# Patient Record
Sex: Female | Born: 1950 | Race: White | Hispanic: No | Smoking: Current every day smoker
Health system: Southern US, Community
[De-identification: ages and names within clinical notes are randomized; demographics above are authoritative.]

## PROBLEM LIST (undated history)

## (undated) DIAGNOSIS — T7840XA Allergy, unspecified, initial encounter: Secondary | ICD-10-CM

## (undated) DIAGNOSIS — I639 Cerebral infarction, unspecified: Secondary | ICD-10-CM

## (undated) DIAGNOSIS — E785 Hyperlipidemia, unspecified: Secondary | ICD-10-CM

## (undated) DIAGNOSIS — R0902 Hypoxemia: Secondary | ICD-10-CM

## (undated) DIAGNOSIS — Z5189 Encounter for other specified aftercare: Secondary | ICD-10-CM

## (undated) DIAGNOSIS — F329 Major depressive disorder, single episode, unspecified: Secondary | ICD-10-CM

## (undated) DIAGNOSIS — J439 Emphysema, unspecified: Secondary | ICD-10-CM

## (undated) DIAGNOSIS — F32A Depression, unspecified: Secondary | ICD-10-CM

## (undated) DIAGNOSIS — M1990 Unspecified osteoarthritis, unspecified site: Secondary | ICD-10-CM

## (undated) DIAGNOSIS — F419 Anxiety disorder, unspecified: Secondary | ICD-10-CM

## (undated) DIAGNOSIS — R011 Cardiac murmur, unspecified: Secondary | ICD-10-CM

## (undated) DIAGNOSIS — I509 Heart failure, unspecified: Secondary | ICD-10-CM

## (undated) DIAGNOSIS — J449 Chronic obstructive pulmonary disease, unspecified: Secondary | ICD-10-CM

## (undated) DIAGNOSIS — M810 Age-related osteoporosis without current pathological fracture: Secondary | ICD-10-CM

## (undated) DIAGNOSIS — K219 Gastro-esophageal reflux disease without esophagitis: Secondary | ICD-10-CM

## (undated) HISTORY — DX: Chronic obstructive pulmonary disease, unspecified: J44.9

## (undated) HISTORY — DX: Hypoxemia: R09.02

## (undated) HISTORY — DX: Unspecified osteoarthritis, unspecified site: M19.90

## (undated) HISTORY — DX: Cerebral infarction, unspecified: I63.9

## (undated) HISTORY — DX: Anxiety disorder, unspecified: F41.9

## (undated) HISTORY — DX: Cardiac murmur, unspecified: R01.1

## (undated) HISTORY — DX: Depression, unspecified: F32.A

## (undated) HISTORY — DX: Allergy, unspecified, initial encounter: T78.40XA

## (undated) HISTORY — DX: Emphysema, unspecified: J43.9

## (undated) HISTORY — DX: Heart failure, unspecified: I50.9

## (undated) HISTORY — DX: Major depressive disorder, single episode, unspecified: F32.9

## (undated) HISTORY — DX: Hyperlipidemia, unspecified: E78.5

## (undated) HISTORY — DX: Gastro-esophageal reflux disease without esophagitis: K21.9

## (undated) HISTORY — PX: CHOLECYSTECTOMY: SHX55

## (undated) HISTORY — DX: Age-related osteoporosis without current pathological fracture: M81.0

## (undated) HISTORY — DX: Encounter for other specified aftercare: Z51.89

## (undated) HISTORY — PX: TUBAL LIGATION: SHX77

---
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
## 2015-05-20 ENCOUNTER — Encounter (INDEPENDENT_AMBULATORY_CARE_PROVIDER_SITE_OTHER): Payer: Self-pay

## 2015-05-20 ENCOUNTER — Ambulatory Visit (INDEPENDENT_AMBULATORY_CARE_PROVIDER_SITE_OTHER): Payer: Medicaid Other | Admitting: Family Medicine

## 2015-05-20 ENCOUNTER — Encounter: Payer: Self-pay | Admitting: Family Medicine

## 2015-05-20 VITALS — BP 138/84 | HR 82 | Temp 99.3°F | Ht 65.0 in | Wt 247.6 lb

## 2015-05-20 DIAGNOSIS — K7290 Hepatic failure, unspecified without coma: Secondary | ICD-10-CM | POA: Diagnosis not present

## 2015-05-20 DIAGNOSIS — F329 Major depressive disorder, single episode, unspecified: Secondary | ICD-10-CM

## 2015-05-20 DIAGNOSIS — Z9181 History of falling: Secondary | ICD-10-CM

## 2015-05-20 DIAGNOSIS — J449 Chronic obstructive pulmonary disease, unspecified: Secondary | ICD-10-CM | POA: Diagnosis not present

## 2015-05-20 DIAGNOSIS — G2581 Restless legs syndrome: Secondary | ICD-10-CM | POA: Diagnosis not present

## 2015-05-20 DIAGNOSIS — F32A Depression, unspecified: Secondary | ICD-10-CM | POA: Insufficient documentation

## 2015-05-20 MED ORDER — PRAMIPEXOLE DIHYDROCHLORIDE 0.125 MG PO TABS: 0.1250 mg | ORAL_TABLET | Freq: Every day | ORAL | Status: AC

## 2015-05-20 MED ORDER — ALBUTEROL SULFATE HFA 108 (90 BASE) MCG/ACT IN AERS: 2.0000 | INHALATION_SPRAY | Freq: Four times a day (QID) | RESPIRATORY_TRACT | Status: AC | PRN

## 2015-05-20 MED ORDER — FLUTICASONE FUROATE-VILANTEROL 100-25 MCG/INH IN AEPB: 1.0000 | INHALATION_SPRAY | Freq: Every day | RESPIRATORY_TRACT | Status: DC

## 2015-05-20 MED ORDER — LACTULOSE ENCEPHALOPATHY 10 GM/15ML PO SOLN: 10.0000 g | Freq: Every day | ORAL | Status: AC

## 2015-05-20 MED ORDER — VENLAFAXINE HCL ER 75 MG PO CP24: 75.0000 mg | ORAL_CAPSULE | Freq: Every day | ORAL | Status: AC

## 2015-05-20 NOTE — Assessment & Plan Note (Signed)
Recent hospitalization in [place] month and a half ago where she was diagnosed with liver failure and started on lactulose. We do not have those labs or records, will request them and reevaluate at that time. For now refill lactulose.

## 2015-05-20 NOTE — Progress Notes (Signed)
BP 138/84 mmHg  Pulse 82  Temp(Src) 99.3 F (37.4 C) (Oral)  Ht 5\' 5"  (1.651 m)  Wt 247 lb 9.6 oz (112.311 kg)  BMI 41.20 kg/m2  SpO2 94%   Subjective:    Patient ID: Kristie Davenport, female    DOB: 1950/11/11, 64 y.o.   MRN: [id number]  HPI: Kristie Davenport is a 64 y.o. female presenting on 05/20/2015 for Establish Care   HPI COPD Patient has been diagnosed with COPD for quite a few years. This is secondary to smoking which she has at least a 40-pack-year history. She is still currently smoking and is using patches which she has at home to try and quit. She currently uses Proventil, Breo Ellipta, oxygen, Spiriva and nebulized albuterol. She was in the hospital recently month and half ago for a COPD exacerbation and that was when she was started on oxygen. She was also on a BiPAP during that hospitalization and recommended to go see a pulmonologist which she has not seen yet. Since the hospitalization in [place] she moved here to [place] and needs a referral here for a pulmonologist. Today in office her shortness of breath is minimal and wheezing is minimal as well per patient. She currently uses 2 L nasal cannula as needed at home. Mostly she uses it at night.  Liver failure Patient was diagnosed with liver failure or liver issues during that same hospitalization over in [place] and was started on lactulose for elevated ammonia levels. She does not know exactly why she developed this liver failure and does not know if she still has it. We will request records and then reassess.  Depression Patient has had depression and anxiety for quite a few years and is currently on Effexor 75 mg. She was also on Wellbutrin in the past for smoking cessation, we will discuss this at a later visit once we request records. Currently she denies any suicidal ideation or thoughts of death.  Restless leg syndrome Patient has been diagnosed with restless leg syndrome and/or obstructive sleep apnea in the past.  currently she is on medications for restless leg syndrome. We will refer to pulmonology for a new sleep study to reevaluate. She is currently taking Mirapex for this and feels that it helps.  Fall risk Patient presents also wanting to get a shower chair for her home because she has increase risk of slipping and falling in the shower and does not want a hip fracture. She has not fallen yet but she feels increasingly unstable with her current illness.  Relevant past medical, surgical, family and social history reviewed and updated as indicated. Interim medical history since our last visit reviewed. Allergies and medications reviewed and updated.  Review of Systems  Constitutional: Negative for fever and chills.  HENT: Negative for congestion, ear discharge, ear pain, rhinorrhea and sinus pressure.   Eyes: Negative for pain, redness and visual disturbance.  Respiratory: Positive for shortness of breath (intermittent but not as bad as the exacerbation once a month and a half ago) and wheezing. Negative for chest tightness.   Cardiovascular: Negative for chest pain and leg swelling.  Gastrointestinal: Negative for nausea, abdominal pain, diarrhea, constipation, blood in stool and abdominal distention.  Endocrine: Negative for cold intolerance and heat intolerance.  Genitourinary: Negative for dysuria and difficulty urinating.  Musculoskeletal: Negative for back pain and gait problem.  Skin: Negative for rash.  Neurological: Negative for dizziness, syncope, weakness, light-headedness and headaches.  Psychiatric/Behavioral: Positive for dysphoric mood and decreased concentration.  Negative for suicidal ideas, behavioral problems, sleep disturbance and agitation. The patient is nervous/anxious.   All other systems reviewed and are negative.   Per HPI unless specifically indicated above     Medication List       This list is accurate as of: 05/20/15 12:11 PM.  Always use your most recent med  list.               albuterol 108 (90 BASE) MCG/ACT inhaler  Commonly known as:  PROVENTIL HFA;VENTOLIN HFA  Inhale 2 puffs into the lungs every 6 (six) hours as needed for wheezing or shortness of breath.     Fluticasone Furoate-Vilanterol 100-25 MCG/INH Aepb  Commonly known as:  BREO ELLIPTA  Inhale 1 puff into the lungs daily.     lactulose (encephalopathy) 10 GM/15ML Soln  Commonly known as:  CHRONULAC  Take 15 mLs (10 g total) by mouth daily.     OXYGEN  Inhale 2 L into the lungs at bedtime.     pramipexole 0.125 MG tablet  Commonly known as:  MIRAPEX  Take 1 tablet (0.125 mg total) by mouth at bedtime.     tiotropium 18 MCG inhalation capsule  Commonly known as:  SPIRIVA  Place 18 mcg into inhaler and inhale daily.     venlafaxine XR 75 MG 24 hr capsule  Commonly known as:  EFFEXOR-XR  Take 1 capsule (75 mg total) by mouth daily with breakfast.           Objective:    BP 138/84 mmHg  Pulse 82  Temp(Src) 99.3 F (37.4 C) (Oral)  Ht  (1.651 m)  Wt 247 lb 9.6 oz (112.311 kg)  BMI 41.20 kg/m2  SpO2 94%  Wt Readings from Last 3 Encounters:  05/20/15 247 lb 9.6 oz (112.311 kg)    Physical Exam  Constitutional: She is oriented to person, place, and time. She appears well-developed and well-nourished. No distress.  HENT:  Right Ear: External ear normal.  Left Ear: External ear normal.  Nose: Nose normal.  Mouth/Throat: Oropharynx is clear and moist. No oropharyngeal exudate.  Eyes: Conjunctivae and EOM are normal. Right eye exhibits no discharge. Left eye exhibits no discharge. No scleral icterus.  Neck: No thyromegaly present.  Cardiovascular: Normal rate and regular rhythm.   No murmur heard. Pulmonary/Chest: Effort normal. No respiratory distress. She has wheezes. She has no rales.  Abdominal: Soft. Bowel sounds are normal. She exhibits no distension. There is no tenderness.  Morbidly obese so difficult to assess whether there is  hepatosplenomegaly.  Musculoskeletal: Normal range of motion. She exhibits no edema or tenderness.  Limping gait, patient has significant difficulties trying to get up onto exam table and had to do exam in the chair.  Lymphadenopathy:    She has no cervical adenopathy.  Neurological: She is alert and oriented to person, place, and time. Coordination normal.  Skin: Skin is warm and dry. No rash noted. She is not diaphoretic.  Psychiatric: She has a normal mood and affect. Her behavior is normal. Judgment and thought content normal.  Vitals reviewed.   No results found for this or any previous visit.    Assessment & Plan:   Problem List Items Addressed This Visit      Respiratory   COPD, severe - Primary    Severe COPD, will refill meds and get new tubing.  Referral to Pulmonology.      Relevant Medications   tiotropium (SPIRIVA) 18 MCG inhalation capsule  OXYGEN   albuterol (PROVENTIL HFA;VENTOLIN HFA) 108 (90 BASE) MCG/ACT inhaler   Fluticasone Furoate-Vilanterol (BREO ELLIPTA) 100-25 MCG/INH AEPB   Other Relevant Orders   Ambulatory referral to Pulmonology     Digestive   Liver failure    Recent hospitalization in [place] month and a half ago where she was diagnosed with liver failure and started on lactulose. We do not have those labs or records, will request them and reevaluate at that time. For now refill lactulose.      Relevant Medications   lactulose, encephalopathy, (CHRONULAC) 10 GM/15ML SOLN     Other   Depression    Recent difficulties with recent hospitalization, will refill meds and recheck after records request.      Relevant Medications   venlafaxine XR (EFFEXOR-XR) 75 MG 24 hr capsule   Restless legs syndrome    Refill medication      Relevant Medications   pramipexole (MIRAPEX) 0.125 MG tablet    Other Visit Diagnoses    Risk for falls        at risk for falls, wants shower chair        Follow up plan: Return in about 4 weeks (around  06/17/2015), or if symptoms worsen or fail to improve.  Arville Care, MD [facility] 05/20/2015, 12:11 PM

## 2015-05-20 NOTE — Assessment & Plan Note (Signed)
Refill medication

## 2015-05-20 NOTE — Patient Instructions (Signed)

## 2015-05-20 NOTE — Assessment & Plan Note (Signed)
Recent difficulties with recent hospitalization, will refill meds and recheck after records request.

## 2015-05-20 NOTE — Assessment & Plan Note (Signed)
Severe COPD, will refill meds and get new tubing.  Referral to Pulmonology.

## 2015-05-21 ENCOUNTER — Telehealth: Payer: Self-pay

## 2015-05-21 MED ORDER — FLUTICASONE-SALMETEROL 250-50 MCG/DOSE IN AEPB: 1.0000 | INHALATION_SPRAY | Freq: Two times a day (BID) | RESPIRATORY_TRACT | Status: DC

## 2015-05-21 NOTE — Telephone Encounter (Signed)
Your prescribed Breo Ellipta  That is non preferred with Medicaid   Preferred meds are Advair Diskus, Dulera inhaler or Symbicort inhaler   If possible please change to one of these.  If have already tried these and failed let me know and I will do prior authorization

## 2015-05-21 NOTE — Telephone Encounter (Signed)
Please switch patient to Advair Diskus 250-50 one puff twice daily. Also please inform patient that this was a change because of her insurance. Arville Care, MD [facility] 05/21/2015, 10:15 AM

## 2015-05-21 NOTE — Telephone Encounter (Signed)
Patient aware of change

## 2015-05-29 ENCOUNTER — Ambulatory Visit (INDEPENDENT_AMBULATORY_CARE_PROVIDER_SITE_OTHER)
Admission: RE | Admit: 2015-05-29 | Discharge: 2015-05-29 | Disposition: A | Discharge status: Home / Self Care | Payer: Medicaid Other | Source: Ambulatory Visit | Attending: Internal Medicine | Admitting: Internal Medicine

## 2015-05-29 ENCOUNTER — Encounter: Payer: Self-pay | Admitting: Internal Medicine

## 2015-05-29 ENCOUNTER — Ambulatory Visit (INDEPENDENT_AMBULATORY_CARE_PROVIDER_SITE_OTHER): Payer: Medicaid Other | Admitting: Internal Medicine

## 2015-05-29 VITALS — BP 124/60 | HR 95 | Ht 65.0 in | Wt 248.0 lb

## 2015-05-29 DIAGNOSIS — F17210 Nicotine dependence, cigarettes, uncomplicated: Secondary | ICD-10-CM | POA: Diagnosis not present

## 2015-05-29 DIAGNOSIS — J449 Chronic obstructive pulmonary disease, unspecified: Secondary | ICD-10-CM

## 2015-05-29 DIAGNOSIS — Z720 Tobacco use: Secondary | ICD-10-CM | POA: Diagnosis not present

## 2015-05-29 MED ORDER — BUDESONIDE-FORMOTEROL FUMARATE 160-4.5 MCG/ACT IN AERO: INHALATION_SPRAY | RESPIRATORY_TRACT | Status: AC

## 2015-05-29 NOTE — Patient Instructions (Addendum)
Stop advair   Plan A = Automatic = Symbicort 160 Take 2 puffs first thing in am and then another 2 puffs about 12 hours                                     Spiriva each am   Plan B = Backup Only use your albuterol as a rescue medication to be used if you can't catch your breath by resting or doing a relaxed purse lip breathing pattern.  - The less you use it, the better it will work when you need it. - Ok to use up to 2 puffs  every 4 hours if you must but call for immediate appointment if use goes up over your usual need - Don't leave home without it !!  (think of it like the spare tire for your car)   Plan C = Nebulizer - only use the nebulizer if you use the proventil first and it doesn't work  The key is to stop smoking completely before smoking completely stops you - this is the most important aspect of your care   Please see patient coordinator before you leave today  to schedule ono Room air.   Please remember to go to the x-ray department downstairs for your tests - we will call you with the results when they are available.     Please schedule a follow up office visit in 4 weeks, sooner if needed with pft

## 2015-05-29 NOTE — Progress Notes (Signed)
Subjective:     Patient ID: Kristie Davenport, female   DOB: 09/06/1951,  MRN: [id number]  HPI  31 yowf still smoking on noct 02 since 2007 thru pulmonary doctor in [place] referred to pulmonary clinic 05/29/2015 by Dr Dettinger.    05/29/2015 1st [place] [facility] office visit/ Wert  Still smoking maint on advair dpi/spiriva dpi/freq saba  Chief Complaint  Patient presents with  . Pulmonary Consult    Referred by Dr. Ivin Booty Dettinger. Pt states that she was dxed with COPD approx 9 yrs ago.  She states she gets SOB "with alot of movement".  She also c/o cough with minimal yellow sputum.  She uses ventolin approx 3 x per day.   last admit [place] July 5th with ams/ high ammonia level / hypercarbia better since d/c to point where can do [employer] shopping leaning on cart/ off 02 but has been wearing 02 at bedtime at 2lpm.some cough and congestion worse winter x decade  No obvious day to day or daytime variability or assoc  cp or chest tightness, subjective wheeze or overt sinus or hb symptoms. No unusual exp hx or h/o childhood pna/ asthma or knowledge of premature birth.  Sleeping ok without nocturnal  or early am exacerbation  of respiratory  c/o's or need for noct saba. Also denies any obvious fluctuation of symptoms with weather or environmental changes or other aggravating or alleviating factors except as outlined above   Current Medications, Allergies, Complete Past Medical History, Past Surgical History, Family History, and Social History were reviewed in [employer] record.  ROS  The following are not active complaints unless bolded sore throat, dysphagia, dental problems, itching, sneezing,  nasal congestion or excess/ purulent secretions, ear ache,   fever, chills, sweats, unintended wt loss, classically pleuritic or exertional cp, hemoptysis,  orthopnea pnd or leg swelling, presyncope, palpitations, abdominal pain, anorexia, nausea, vomiting, diarrhea  or change in bowel or  bladder habits, change in stools or urine, dysuria,hematuria,  rash, arthralgias, visual complaints, headache, numbness, weakness or ataxia or problems with walking or coordination,  change in mood/affect or memory.             Review of Systems     Objective:   Physical Exam Very hoarse amb wf nad   Wt Readings from Last 3 Encounters:  05/29/15 248 lb (112.492 kg)  05/20/15 247 lb 9.6 oz (112.311 kg)    Vital signs reviewed    HEENT: very poor dentition,  Nl turbinates, and orophanx. Nl external ear canals without cough reflex   NECK :  without JVD/Nodes/TM/ nl carotid upstrokes bilaterally   LUNGS: no acc muscle use, min insp and exp rhonchi bilaterally    CV:  RRR  no s3 or murmur or increase in P2, no edema   ABD:  Obese/ pos hoover mid insp  in the supine position. No bruits or organomegaly, bowel sounds nl  MS:  warm without deformities, calf tenderness, cyanosis or clubbing  SKIN: warm and dry without lesions    NEURO:  alert, approp, no deficits    CXR PA and Lateral:   05/29/2015 :     I personally reviewed images and agree with radiology impression as follows:    COPD. There is no evidence of pneumonia, pulmonary edema, or other acute cardiopulmonary abnormality.        Assessment:

## 2015-05-30 DIAGNOSIS — F17210 Nicotine dependence, cigarettes, uncomplicated: Secondary | ICD-10-CM | POA: Insufficient documentation

## 2015-05-30 NOTE — Assessment & Plan Note (Addendum)
-  05/29/2015  Walked RA  2 laps @ 185 ft each stopped due to  Sob but not desat, nl pace    She has MMRC 2 doe but no desaturations so likely a GOLD III with ? Ab component and overusing saba at baseline  DDX of  difficult airways management all start with A and  include Adherence, Ace Inhibitors, Acid Reflux, Active Sinus Disease, Alpha 1 Antitripsin deficiency, Anxiety masquerading as Airways dz,  ABPA,  allergy(esp in young), Aspiration (esp in elderly), Adverse effects of meds,  Active smokers, A bunch of PE's (a small clot burden can't cause this syndrome unless there is already severe underlying pulm or vascular dz with poor reserve) plus two Bs  = Bronchiectasis and Beta blocker use..and one C= CHF  Adherence is always the initial "prime suspect" and is a multilayered concern that requires a "trust but verify" approach in every patient - starting with knowing how to use medications, especially inhalers, correctly, keeping up with refills and understanding the fundamental difference between maintenance and prns vs those medications only taken for a very short course and then stopped and not refilled.  -The proper method of use, as well as anticipated side effects, of a metered-dose inhaler are discussed and demonstrated to the patient. Improved effectiveness after extensive coaching during this visit to a level of approximately  75% so worth trying symbiocrt 160 2bid and spiriva  Active smoking discussed sep a/p  ? Adverse effects of dpi > advair is likely the cause of her hoarseness > try off it first then consider change spiriva to respimat   I had an extended discussion with the patient reviewing all relevant studies completed to date and  lasting 15 to 20 minutes of a 25 minute visit    Each maintenance medication was reviewed in detail including most importantly the difference between maintenance and prns and under what circumstances the prns are to be triggered using an action plan format  that is not reflected in the computer generated alphabetically organized AVS.    Please see instructions for details which were reviewed in writing and the patient given a copy highlighting the part that I personally wrote and discussed at today's ov.   Total time = 49m review case with pt/ discussion/ counseling/ giving and going over instructions (see avs)

## 2015-05-30 NOTE — Assessment & Plan Note (Signed)

## 2015-06-02 NOTE — Progress Notes (Signed)
Quick Note:  Spoke with pt and notified of results per Dr. Wert. Pt verbalized understanding and denied any questions.  ______ 

## 2015-06-05 ENCOUNTER — Encounter: Payer: Self-pay | Admitting: Internal Medicine

## 2015-06-05 ENCOUNTER — Telehealth: Payer: Self-pay | Admitting: *Deleted

## 2015-06-05 DIAGNOSIS — J9611 Chronic respiratory failure with hypoxia: Secondary | ICD-10-CM | POA: Insufficient documentation

## 2015-06-05 DIAGNOSIS — G4734 Idiopathic sleep related nonobstructive alveolar hypoventilation: Secondary | ICD-10-CM

## 2015-06-05 NOTE — Telephone Encounter (Signed)
Spoke with pt and notified of results per Dr. Wert. Pt verbalized understanding and denied any questions. Orders sent to [facility] 

## 2015-06-05 NOTE — Telephone Encounter (Signed)
Per MW- ONO on RA pos  Needs to start 2lpm with sleep and repeat ONO 2lpm  DME- [facility]   LMTCB for the pt

## 2015-06-17 ENCOUNTER — Ambulatory Visit (INDEPENDENT_AMBULATORY_CARE_PROVIDER_SITE_OTHER): Payer: Medicaid Other | Admitting: Family Medicine

## 2015-06-17 ENCOUNTER — Encounter: Payer: Self-pay | Admitting: Family Medicine

## 2015-06-17 VITALS — BP 110/67 | HR 92 | Temp 98.2°F | Ht 65.0 in | Wt 250.0 lb

## 2015-06-17 DIAGNOSIS — M545 Low back pain, unspecified: Secondary | ICD-10-CM

## 2015-06-17 DIAGNOSIS — J449 Chronic obstructive pulmonary disease, unspecified: Secondary | ICD-10-CM

## 2015-06-17 DIAGNOSIS — R5382 Chronic fatigue, unspecified: Secondary | ICD-10-CM | POA: Diagnosis not present

## 2015-06-17 DIAGNOSIS — K7290 Hepatic failure, unspecified without coma: Secondary | ICD-10-CM

## 2015-06-17 MED ORDER — TIOTROPIUM BROMIDE MONOHYDRATE 18 MCG IN CAPS: 18.0000 ug | ORAL_CAPSULE | Freq: Every day | RESPIRATORY_TRACT | Status: DC

## 2015-06-17 MED ORDER — TRAMADOL HCL 50 MG PO TABS: 50.0000 mg | ORAL_TABLET | Freq: Two times a day (BID) | ORAL | Status: DC | PRN

## 2015-06-17 NOTE — Assessment & Plan Note (Signed)
COPD is doing a lot better, we will refill her Spiriva. Her COPD is currently being managed by Dr. Shona Simpson from pulmonology. We will follow his advice and recommendations from here on out.

## 2015-06-17 NOTE — Assessment & Plan Note (Signed)
Patient has a history of liver failure and is currently on lactulose and MiraLAX sporadically. We will check her liver function and ammonia levels today.

## 2015-06-17 NOTE — Progress Notes (Signed)
BP 110/67 mmHg  Pulse 92  Temp(Src) 98.2 F (36.8 C) (Oral)  Ht 5' 5"  (1.651 m)  Wt 250 lb (113.399 kg)  BMI 41.60 kg/m2   Subjective:    Patient ID: Kristie Davenport, female    DOB: 12-07-1950, 64 y.o.   MRN: [id number]  HPI: Kristie Davenport is a 64 y.o. female presenting on 06/17/2015 for Followup depression, restless leg   HPI Fatigue Patient has been having fatigue for quite some time because of her illnesses such as going through. She would like some lab testing for this she hasn't had yet but she has been feeling much improved from previous. General he is just described as decreased energy. Of note pulmonology did an overnight pulse ox and found that she does dip down at night and gave her oxygen at night to help. This may have been contributing and may improve her energy as she gets more accustomed to the oxygen.  COPD Patient known to Dr. Eulis Foster pulmonologist who is now going to be managing her COPD. She feels like she is doing a lot better since she has been seeing him.  Low back pain Patient complains of midline low back pain that is acutely worsened over the past few weeks but is something that she has chronically. She has been on medications for this before and has had imaging and an operation on her lower back. Before she took tramadol and that helped a lot with this and she would like to have that again. She does not have any tingling or numbness or radiation down into her lower legs. She also denies any weakness in her lower legs.  Liver failure Patient has history of liver failure per her and per the notes that we got from her previous physician and is currently on lactulose. We will recheck her liver function and her ammonia levels today. She denies any new symptoms or abdominal pain from this or swelling from this.  Relevant past medical, surgical, family and social history reviewed and updated as indicated. Interim medical history since our last visit reviewed. Allergies and  medications reviewed and updated.  Review of Systems  Constitutional: Positive for fatigue (much improved). Negative for fever and chills.  HENT: Negative for congestion, ear discharge, ear pain, rhinorrhea and sinus pressure.   Eyes: Negative for pain, redness and visual disturbance.  Respiratory: Positive for shortness of breath (Much improved) and wheezing (much improved). Negative for chest tightness.   Cardiovascular: Negative for chest pain and leg swelling.  Gastrointestinal: Negative for nausea, abdominal pain, diarrhea, constipation, blood in stool and abdominal distention.  Endocrine: Negative for cold intolerance and heat intolerance.  Genitourinary: Negative for dysuria and difficulty urinating.  Musculoskeletal: Positive for back pain. Negative for gait problem.  Skin: Negative for rash.  Neurological: Negative for dizziness, syncope, weakness, light-headedness, numbness and headaches.  Psychiatric/Behavioral: Positive for dysphoric mood and decreased concentration. Negative for suicidal ideas, behavioral problems, sleep disturbance and agitation. The patient is nervous/anxious.   All other systems reviewed and are negative.   Per HPI unless specifically indicated above     Medication List       This list is accurate as of: 06/17/15 11:25 AM.  Always use your most recent med list.               albuterol 108 (90 BASE) MCG/ACT inhaler  Commonly known as:  PROVENTIL HFA;VENTOLIN HFA  Inhale 2 puffs into the lungs every 6 (six) hours as needed for wheezing  or shortness of breath.     budesonide-formoterol 160-4.5 MCG/ACT inhaler  Commonly known as:  SYMBICORT  Take 2 puffs first thing in am and then another 2 puffs about 12 hours later.     lactulose (encephalopathy) 10 GM/15ML Soln  Commonly known as:  CHRONULAC  Take 15 mLs (10 g total) by mouth daily.     OXYGEN  Inhale 2 L into the lungs at bedtime.     pramipexole 0.125 MG tablet  Commonly known as:   MIRAPEX  Take 1 tablet (0.125 mg total) by mouth at bedtime.     tiotropium 18 MCG inhalation capsule  Commonly known as:  SPIRIVA  Place 1 capsule (18 mcg total) into inhaler and inhale daily.     traMADol 50 MG tablet  Commonly known as:  ULTRAM  Take 1 tablet (50 mg total) by mouth every 12 (twelve) hours as needed.     venlafaxine XR 75 MG 24 hr capsule  Commonly known as:  EFFEXOR-XR  Take 1 capsule (75 mg total) by mouth daily with breakfast.           Objective:    BP 110/67 mmHg  Pulse 92  Temp(Src) 98.2 F (36.8 C) (Oral)  Ht 5' 5"  (1.651 m)  Wt 250 lb (113.399 kg)  BMI 41.60 kg/m2  Wt Readings from Last 3 Encounters:  06/17/15 250 lb (113.399 kg)  05/29/15 248 lb (112.492 kg)  05/20/15 247 lb 9.6 oz (112.311 kg)    Physical Exam  Constitutional: She is oriented to person, place, and time. She appears well-developed and well-nourished. No distress.  Eyes: Conjunctivae and EOM are normal. Pupils are equal, round, and reactive to light.  Neck: Neck supple. No thyromegaly present.  Cardiovascular: Normal rate, regular rhythm, normal heart sounds and intact distal pulses.   No murmur heard. Pulmonary/Chest: Effort normal and breath sounds normal. No respiratory distress. She has no wheezes.  Musculoskeletal: Normal range of motion. She exhibits tenderness (midline low back pain around L2-L3, Negative straight leg raise bilaterally). She exhibits no edema.  Lymphadenopathy:    She has no cervical adenopathy.  Neurological: She is alert and oriented to person, place, and time. Coordination normal.  Skin: Skin is warm and dry. No rash noted. She is not diaphoretic.  Psychiatric: She has a normal mood and affect. Her behavior is normal.  Vitals reviewed.   No results found for this or any previous visit.    Assessment & Plan:   Problem List Items Addressed This Visit      Respiratory   COPD pfts pending     COPD is doing a lot better, we will refill her  Spiriva. Her COPD is currently being managed by Dr. Marlene Lard from pulmonology. We will follow his advice and recommendations from here on out.      Relevant Medications   tiotropium (SPIRIVA) 18 MCG inhalation capsule     Digestive   Liver failure    Patient has a history of liver failure and is currently on lactulose and MiraLAX sporadically. We will check her liver function and ammonia levels today.      Relevant Orders   Lipid panel   CMP14+EGFR   Ammonia    Other Visit Diagnoses    COPD, severe    -  Primary    Relevant Medications    tiotropium (SPIRIVA) 18 MCG inhalation capsule    Other Relevant Orders    CBC with Differential/Platelet    Midline low  back pain without sciatica        Patient has low back pain that is chronic and is taking tramadol before and would like to try that again.    Relevant Medications    traMADol (ULTRAM) 50 MG tablet    Chronic fatigue        Patient has low-grade chronic fatigue likely chronic disease related.    Relevant Orders    Thyroid Panel With TSH        Follow up plan: Return in about 3 months (around 09/16/2015), or if symptoms worsen or fail to improve, for depression.  Caryl Pina, MD [facility] 06/17/2015, 11:25 AM

## 2015-06-18 ENCOUNTER — Other Ambulatory Visit: Payer: Self-pay

## 2015-06-18 DIAGNOSIS — J449 Chronic obstructive pulmonary disease, unspecified: Secondary | ICD-10-CM

## 2015-06-18 LAB — CBC WITH DIFFERENTIAL/PLATELET
BASOS: 0 %
Basophils Absolute: 0 10*3/uL (ref 0.0–0.2)
EOS (ABSOLUTE): 0.1 10*3/uL (ref 0.0–0.4)
Eos: 1 %
Hematocrit: 38.3 % (ref 34.0–46.6)
Hemoglobin: 12.3 g/dL (ref 11.1–15.9)
IMMATURE GRANULOCYTES: 0 %
Immature Grans (Abs): 0 10*3/uL (ref 0.0–0.1)
Lymphocytes Absolute: 4 10*3/uL — ABNORMAL HIGH (ref 0.7–3.1)
Lymphs: 29 %
MCH: 27.6 pg (ref 26.6–33.0)
MCHC: 32.1 g/dL (ref 31.5–35.7)
MCV: 86 fL (ref 79–97)
MONOS ABS: 0.6 10*3/uL (ref 0.1–0.9)
Monocytes: 5 %
NEUTROS PCT: 65 %
Neutrophils Absolute: 8.8 10*3/uL — ABNORMAL HIGH (ref 1.4–7.0)
PLATELETS: 411 10*3/uL — AB (ref 150–379)
RBC: 4.46 x10E6/uL (ref 3.77–5.28)
RDW: 18.3 % — AB (ref 12.3–15.4)
WBC: 13.6 10*3/uL — AB (ref 3.4–10.8)

## 2015-06-18 LAB — LIPID PANEL
CHOL/HDL RATIO: 4.6 ratio — AB (ref 0.0–4.4)
CHOLESTEROL TOTAL: 207 mg/dL — AB (ref 100–199)
HDL: 45 mg/dL (ref >39)
LDL CALC: 124 mg/dL — AB (ref 0–99)
TRIGLYCERIDES: 192 mg/dL — AB (ref 0–149)
VLDL CHOLESTEROL CAL: 38 mg/dL (ref 5–40)

## 2015-06-18 LAB — CMP14+EGFR
ALT: 10 IU/L (ref 0–32)
AST: 10 IU/L (ref 0–40)
Albumin/Globulin Ratio: 1.8 (ref 1.1–2.5)
Albumin: 4.2 g/dL (ref 3.6–4.8)
Alkaline Phosphatase: 90 IU/L (ref 39–117)
BUN/Creatinine Ratio: 15 (ref 11–26)
BUN: 9 mg/dL (ref 8–27)
Bilirubin Total: <0.2 mg/dL (ref 0.0–1.2)
CALCIUM: 9.7 mg/dL (ref 8.7–10.3)
CO2: 26 mmol/L (ref 18–29)
CREATININE: 0.62 mg/dL (ref 0.57–1.00)
Chloride: 99 mmol/L (ref 97–108)
GFR calc Af Amer: 112 mL/min/{1.73_m2} (ref >59)
GFR, EST NON AFRICAN AMERICAN: 97 mL/min/{1.73_m2} (ref >59)
Globulin, Total: 2.3 g/dL (ref 1.5–4.5)
Glucose: 75 mg/dL (ref 65–99)
Potassium: 4.6 mmol/L (ref 3.5–5.2)
Sodium: 141 mmol/L (ref 134–144)
Total Protein: 6.5 g/dL (ref 6.0–8.5)

## 2015-06-18 LAB — THYROID PANEL WITH TSH
Free Thyroxine Index: 1.6 (ref 1.2–4.9)
T3 UPTAKE RATIO: 25 % (ref 24–39)
T4 TOTAL: 6.3 ug/dL (ref 4.5–12.0)
TSH: 0.936 u[IU]/mL (ref 0.450–4.500)

## 2015-06-18 MED ORDER — TIOTROPIUM BROMIDE MONOHYDRATE 18 MCG IN CAPS: 18.0000 ug | ORAL_CAPSULE | Freq: Every day | RESPIRATORY_TRACT | Status: AC

## 2015-06-19 ENCOUNTER — Telehealth: Payer: Self-pay | Admitting: Family Medicine

## 2015-06-19 NOTE — Telephone Encounter (Signed)
rx called in under Paulene Floor, FNP. Verbal ok from Paulene Floor to put in her name.

## 2015-06-26 ENCOUNTER — Ambulatory Visit (INDEPENDENT_AMBULATORY_CARE_PROVIDER_SITE_OTHER): Payer: Medicaid Other

## 2015-06-26 DIAGNOSIS — Z23 Encounter for immunization: Secondary | ICD-10-CM | POA: Diagnosis not present

## 2015-06-26 DIAGNOSIS — J441 Chronic obstructive pulmonary disease with (acute) exacerbation: Secondary | ICD-10-CM

## 2015-06-26 NOTE — Progress Notes (Signed)
Prevnar 13 and Fluarix given. Pt tolerated well.

## 2015-06-26 NOTE — Addendum Note (Signed)
Addended by: Ardine Eng A on: 06/26/2015 12:01 PM   Modules accepted: Orders

## 2015-07-02 ENCOUNTER — Ambulatory Visit: Payer: Medicaid Other | Admitting: Internal Medicine

## 2015-08-14 ENCOUNTER — Ambulatory Visit (INDEPENDENT_AMBULATORY_CARE_PROVIDER_SITE_OTHER): Payer: Medicaid Other | Admitting: Internal Medicine

## 2015-08-14 ENCOUNTER — Encounter: Payer: Self-pay | Admitting: Internal Medicine

## 2015-08-14 VITALS — BP 118/64 | HR 100 | Ht 65.0 in | Wt 260.0 lb

## 2015-08-14 DIAGNOSIS — J449 Chronic obstructive pulmonary disease, unspecified: Secondary | ICD-10-CM | POA: Diagnosis not present

## 2015-08-14 DIAGNOSIS — Z720 Tobacco use: Secondary | ICD-10-CM | POA: Diagnosis not present

## 2015-08-14 DIAGNOSIS — F17210 Nicotine dependence, cigarettes, uncomplicated: Secondary | ICD-10-CM

## 2015-08-14 DIAGNOSIS — J9611 Chronic respiratory failure with hypoxia: Secondary | ICD-10-CM

## 2015-08-14 LAB — PULMONARY FUNCTION TEST
DL/VA % PRED: 82 %
DL/VA: 4.04 ml/min/mmHg/L
DLCO UNC % PRED: 71 %
DLCO UNC: 18.19 ml/min/mmHg
FEF 25-75 PRE: 0.5 L/s
FEF 25-75 Post: 0.55 L/sec
FEF2575-%CHANGE-POST: 9 %
FEF2575-%PRED-POST: 23 %
FEF2575-%PRED-PRE: 21 %
FEV1-%Change-Post: 5 %
FEV1-%PRED-POST: 48 %
FEV1-%Pred-Pre: 46 %
FEV1-Post: 1.25 L
FEV1-Pre: 1.19 L
FEV1FVC-%Change-Post: 0 %
FEV1FVC-%Pred-Pre: 66 %
FEV6-%CHANGE-POST: 4 %
FEV6-%PRED-POST: 71 %
FEV6-%Pred-Pre: 68 %
FEV6-PRE: 2.23 L
FEV6-Post: 2.33 L
FEV6FVC-%CHANGE-POST: 0 %
FEV6FVC-%PRED-PRE: 101 %
FEV6FVC-%Pred-Post: 100 %
FVC-%CHANGE-POST: 5 %
FVC-%PRED-POST: 71 %
FVC-%Pred-Pre: 67 %
FVC-Post: 2.41 L
FVC-Pre: 2.29 L
POST FEV1/FVC RATIO: 52 %
POST FEV6/FVC RATIO: 97 %
PRE FEV1/FVC RATIO: 52 %
Pre FEV6/FVC Ratio: 97 %
RV % pred: 164 %
RV: 3.43 L
TLC % pred: 114 %
TLC: 5.95 L

## 2015-08-14 NOTE — Progress Notes (Signed)
PFT done today. 

## 2015-08-14 NOTE — Patient Instructions (Signed)
The key is to stop smoking completely before smoking completely stops you!     If you are satisfied with your treatment plan,  let your doctor know and he/she can either refill your medications or you can return here when your prescription runs out.     If in any way you are not 100% satisfied,  please tell us.  If 100% better, tell your friends!  Pulmonary follow up is as needed        

## 2015-08-14 NOTE — Progress Notes (Signed)
Subjective:     Patient ID: Kristie CablesMary Davenport, female   DOB: 03-13-1951,  MRN: [id number]    Brief patient profile:  [id number] yowf still smoking on noct 02 since 2007 thru pulmonary doctor in [place] referred to pulmonary clinic 05/29/2015 by Dr Dettinger.   History of Present Illness  05/29/2015 1st Kristie Davenport visit/ Kristie Davenport  Still smoking maint on advair dpi/spiriva dpi/freq saba  Chief Complaint  Patient presents with  . Pulmonary Consult    Referred by Dr. Ivin BootyJoshua Dettinger. Pt states that she was dxed with COPD approx 9 yrs ago.  She states she gets SOB "with alot of movement".  She also c/o cough with minimal yellow sputum.  She uses ventolin approx 3 x per day.   last admit [place] 5th with ams/ high ammonia level / hypercarbia better since d/c to point where can do [employer] shopping leaning on cart/ off 02 but has been wearing 02 at bedtime at 2lpm.some cough and congestion worse winter x decade rec Stop advair  Plan A = Automatic = Symbicort 160 Take 2 puffs first thing in am and then another 2 puffs about 12 hours                                     Spiriva each am  Plan B = Backup Only use your albuterol as a rescue medication Plan C = Nebulizer - only use the nebulizer if you use the proventil first and it doesn't work The key is to stop smoking completely before smoking completely stops you - this is the most important aspect of your care  Please see patient coordinator before you leave today  to schedule ono Room air > desats      08/14/2015  f/u ov/Kristie Davenport re: GOLD III/ 02 2lpm at hs  / still smoking / on symbicort and spiriva Chief Complaint  Patient presents with  . Follow-up    pt following today with PFT. pt sttaes she is doing pretty good, she is walking more and doing things around the house.no c/o cough, wheezing, or chest tightness.    chronic doe = MMRC2 = can't walk a nl pace on a flat grade s sob    Using albuterol saba maybe once a day / planning to move back to  [place] [employer]   No obvious day to day or daytime variability or assoc cough  cp or chest tightness, subjective wheeze or overt sinus or hb symptoms. No unusual exp hx or h/o childhood pna/ asthma or knowledge of premature birth.  Sleeping ok without nocturnal  or early am exacerbation  of respiratory  c/o's or need for noct saba. Also denies any obvious fluctuation of symptoms with weather or environmental changes or other aggravating or alleviating factors except as outlined above   Current Medications, Allergies, Complete Past Medical History, Past Surgical History, Family History, and Social History were reviewed in [facility] electronic medical record.  ROS  The following are not active complaints unless bolded sore throat, dysphagia, dental problems, itching, sneezing,  nasal congestion or excess/ purulent secretions, ear ache,   fever, chills, sweats, unintended wt loss, classically pleuritic or exertional cp, hemoptysis,  orthopnea pnd or leg swelling, presyncope, palpitations, abdominal pain, anorexia, nausea, vomiting, diarrhea  or change in bowel or bladder habits, change in stools or urine, dysuria,hematuria,  rash, arthralgias, visual complaints, headache, numbness, weakness or ataxia  or problems with walking or coordination,  change in mood/affect or memory.            Objective:   Physical Exam   Very hoarse amb wf nad    Wt Readings from Last 3 Encounters:  08/14/15 260 lb (117.935 kg)  06/17/15 250 lb (113.399 kg)  05/29/15 248 lb (112.492 kg)    Vital signs reviewed    HEENT: very poor dentition,  Nl turbinates, and orophanx. Nl external ear canals without cough reflex   NECK :  without JVD/Nodes/TM/ nl carotid upstrokes bilaterally   LUNGS: no acc muscle use, min insp and exp rhonchi bilaterally    CV:  RRR  no s3 or murmur or increase in P2, no edema   ABD:  Obese/ pos hoover mid insp  in the supine position. No bruits or organomegaly, bowel sounds  nl  MS:  warm without deformities, calf tenderness, cyanosis or clubbing  SKIN: warm and dry without lesions    NEURO:  alert, approp, no deficits    CXR PA and Lateral:   05/29/2015 :     I personally reviewed images and agree with radiology impression as follows:    COPD. There is no evidence of pneumonia, pulmonary edema, or other acute cardiopulmonary abnormality.        Assessment:        Outpatient Encounter Prescriptions as of 08/14/2015  Medication Sig  . albuterol (PROVENTIL HFA;VENTOLIN HFA) 108 (90 BASE) MCG/ACT inhaler Inhale 2 puffs into the lungs every 6 (six) hours as needed for wheezing or shortness of breath.  . budesonide-formoterol (SYMBICORT) 160-4.5 MCG/ACT inhaler Take 2 puffs first thing in am and then another 2 puffs about 12 hours later.  . lactulose, encephalopathy, (CHRONULAC) 10 GM/15ML SOLN Take 15 mLs (10 g total) by mouth daily.  . OXYGEN Inhale 2 L into the lungs at bedtime.  . pramipexole (MIRAPEX) 0.125 MG tablet Take 1 tablet (0.125 mg total) by mouth at bedtime.  Marland Kitchen tiotropium (SPIRIVA) 18 MCG inhalation capsule Place 1 capsule (18 mcg total) into inhaler and inhale daily.  . traMADol (ULTRAM) 50 MG tablet Take 1 tablet (50 mg total) by mouth every 12 (twelve) hours as needed.  . venlafaxine XR (EFFEXOR-XR) 75 MG 24 hr capsule Take 1 capsule (75 mg total) by mouth daily with breakfast.   No facility-administered encounter medications on file as of 08/14/2015.

## 2015-08-15 NOTE — Assessment & Plan Note (Signed)
ono RA 06/03/15   02 sat < 89% x 1h 1254m >  06/05/2015 rec 2lpm hs and repeat   rec repeat ono 2lpm

## 2015-08-15 NOTE — Assessment & Plan Note (Addendum)
-   05/29/2015 p extensive coaching HFA effectiveness = 75% -05/29/2015  Walked RA  2 laps @ 185 ft each stopped due to  Sob but not desat, nl pace   - PFT's  08/14/2015  FEV1 1.25 (48 % ) ratio 52  p 5 % improvement from saba (p am symbicort) with DLCO  71 % corrects to 82 % for alv volume    Despite active smoking she is relatively well compensated on her present regimen of Symbicort and Spiriva but really nothing else to offer at this point other than reinforcing the message that she needs to quit smoking now before slipping further. See separate assessment and plan    I had an extended discussion with the patient reviewing all relevant studies completed to date and  lasting 15 to 20 minutes of a 25 minute visit    Each maintenance medication was reviewed in detail including most importantly the difference between maintenance and prns and under what circumstances the prns are to be triggered using an action plan format that is not reflected in the computer generated alphabetically organized AVS.    Please see instructions for details which were reviewed in writing and the patient given a copy highlighting the part that I personally wrote and discussed at today's ov.

## 2015-08-15 NOTE — Assessment & Plan Note (Signed)
>   3 min discussion I reviewed the Fletcher curve with the patient that basically indicates  if you quit smoking when your best day FEV1 is still well preserved (as is relatively still   the case here)  it is highly unlikely you will progress to severe disease and informed the patient there was no medication on the market that has proven to alter the curve/ its downward trajectory  or the likelihood of progression of their disease.  Therefore stopping smoking and maintaining abstinence is the most important aspect of care, not choice of inhalers or for that matter, doctors.   

## 2015-08-18 ENCOUNTER — Other Ambulatory Visit: Payer: Self-pay | Admitting: Family Medicine

## 2015-08-18 NOTE — Telephone Encounter (Signed)
Dettingers pt, last seen 06/17/15, last filled 07/17/15. Rx will print, call her to pick up

## 2015-08-19 ENCOUNTER — Telehealth: Payer: Self-pay

## 2015-08-19 MED ORDER — TRAMADOL HCL 50 MG PO TABS: 50.0000 mg | ORAL_TABLET | Freq: Two times a day (BID) | ORAL | Status: AC | PRN

## 2015-08-19 NOTE — Telephone Encounter (Signed)
Pt aware rx ready to be picked up °

## 2015-08-19 NOTE — Telephone Encounter (Signed)
Okay to prevent refill, patient will need to see me prior to next refill. [name], MD [facility] 08/19/2015, 7:58 AM

## 2015-08-31 ENCOUNTER — Telehealth: Payer: Self-pay | Admitting: Internal Medicine

## 2015-08-31 NOTE — Telephone Encounter (Signed)
Will make MW aware.  

## 2015-09-16 ENCOUNTER — Ambulatory Visit: Payer: Medicaid Other | Admitting: Family Medicine

## 2015-09-17 ENCOUNTER — Encounter: Payer: Self-pay | Admitting: Family Medicine

## 2016-01-30 IMAGING — CR DG CHEST 2V
2 series · 2 of 2 positions shown · non-contrast
Comparison: None in PACs

CLINICAL DATA: History of COPD, CHF, and current smoker. ,
currently asymptomatic.

EXAM:
CHEST  2 VIEW

[view not recorded (1 of 2)]
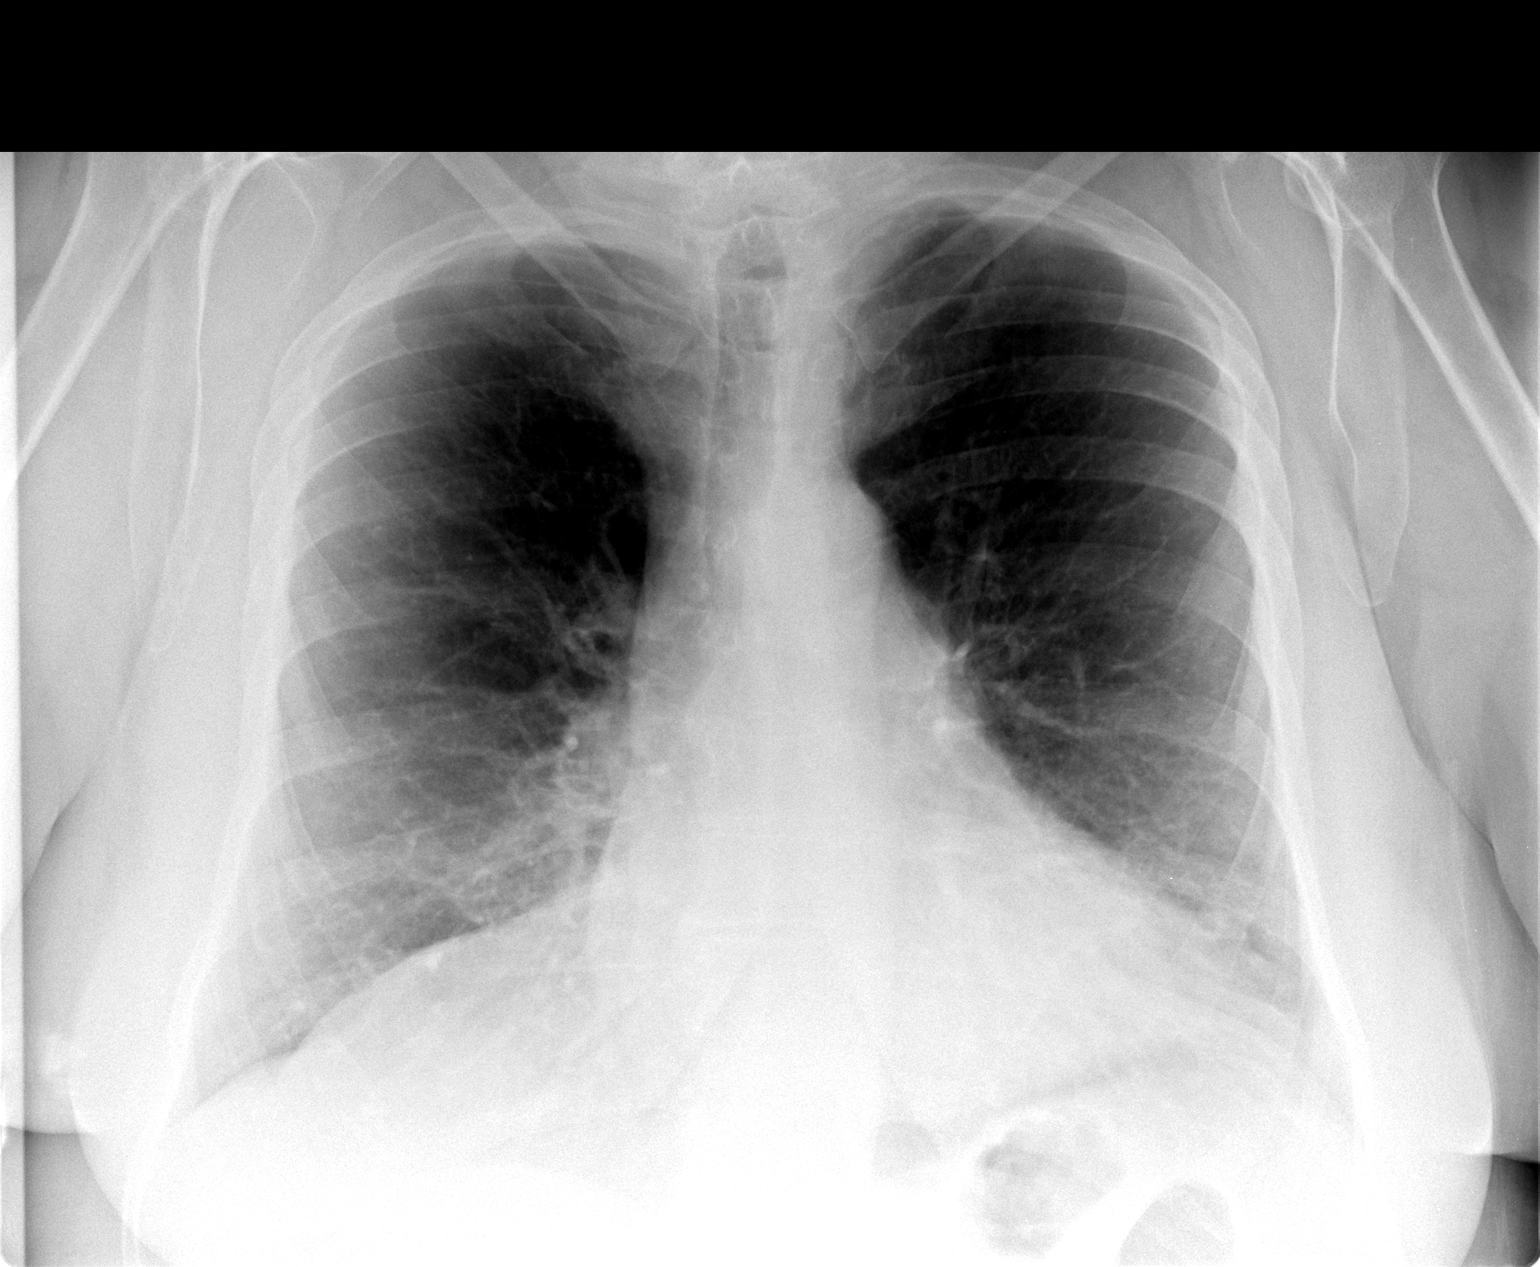

[view not recorded (2 of 2)]
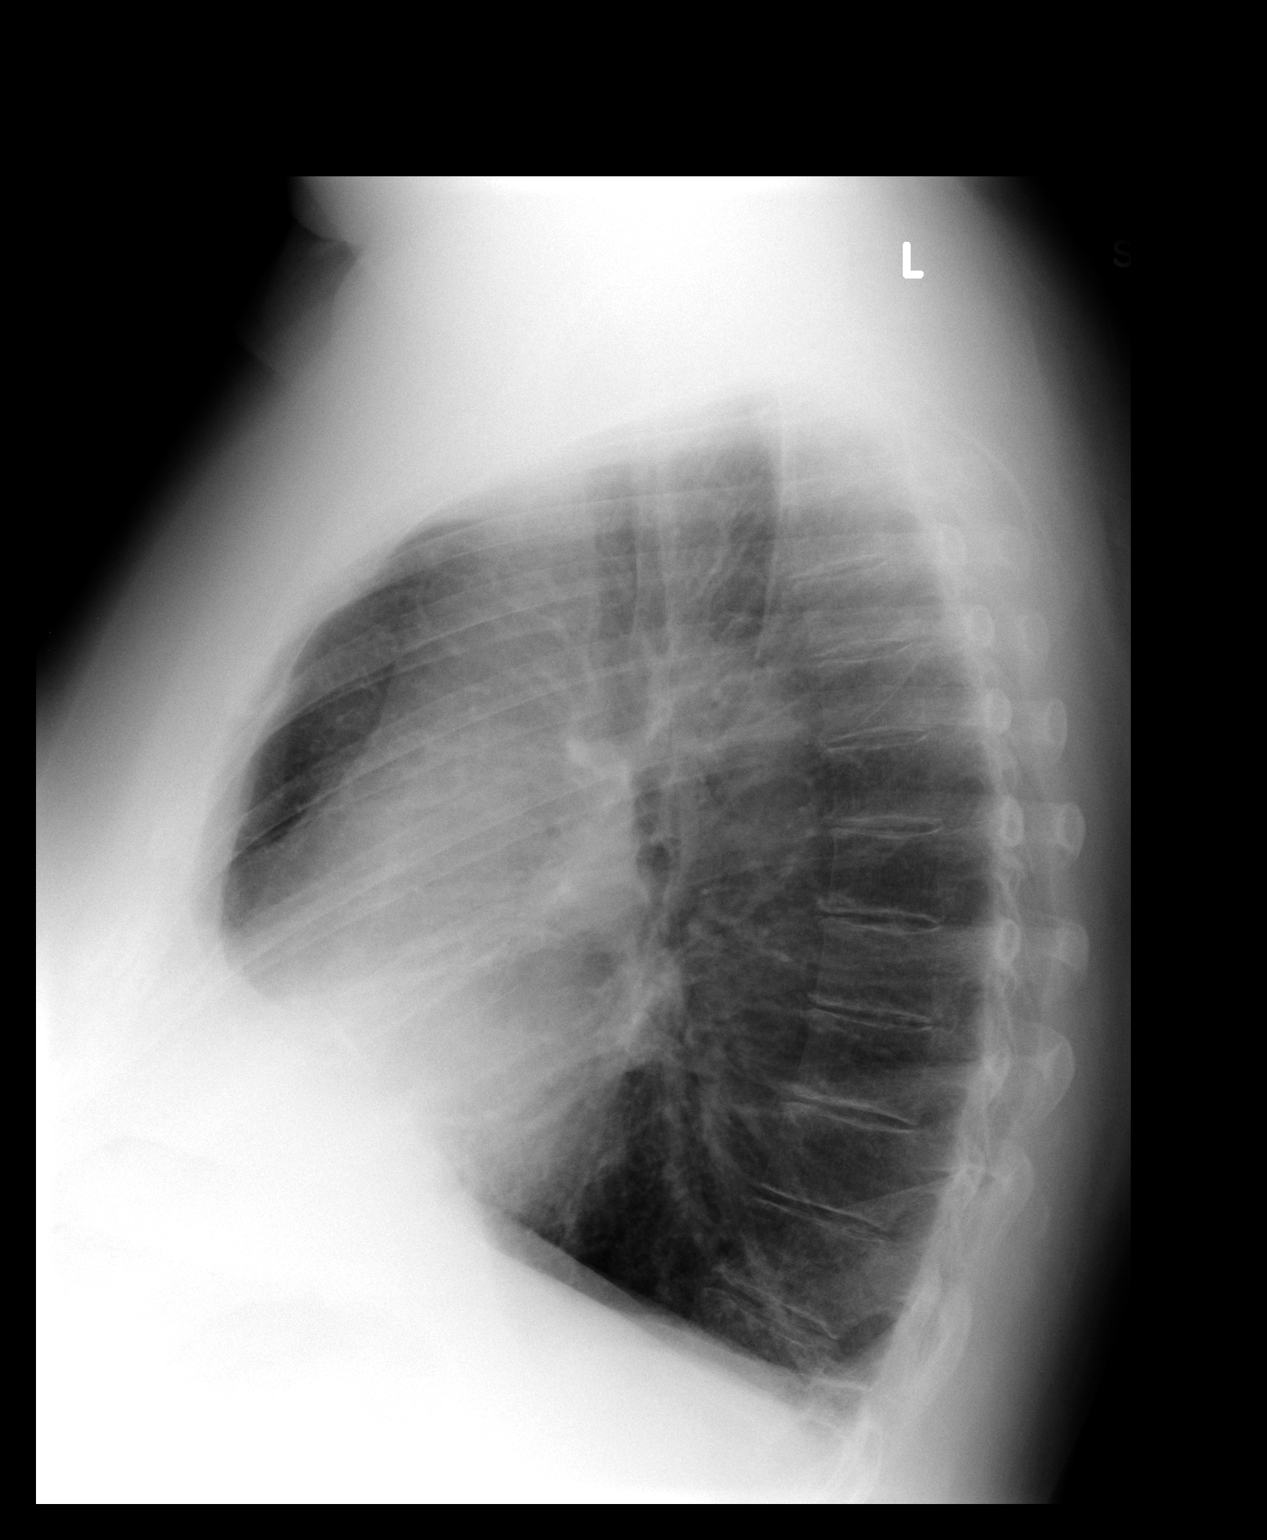

[2 of 2 positions shown; findings below may reference images not displayed]

FINDINGS: The lungs are mildly hyperinflated with hemidiaphragm flattening.
There is no focal infiltrate. There is no pleural effusion. The
cardiac silhouette is top-normal in size. The pulmonary vascularity
is normal. The mediastinum is normal in width. The bony thorax
exhibits no acute abnormality.
IMPRESSION: COPD. There is no evidence of pneumonia, pulmonary edema, or other
acute cardiopulmonary abnormality.

## 2023-10-28 DEATH — deceased

## 2024-03-14 ENCOUNTER — Other Ambulatory Visit (HOSPITAL_COMMUNITY): Payer: Self-pay
